# Patient Record
Sex: Female | Born: 1955 | Race: Black or African American | Hispanic: No | Marital: Married
Health system: Southern US, Community
[De-identification: ages and names within clinical notes are randomized; demographics above are authoritative.]

---

## 2014-12-25 ENCOUNTER — Other Ambulatory Visit: Payer: Self-pay | Admitting: Family Medicine

## 2014-12-25 DIAGNOSIS — Z1231 Encounter for screening mammogram for malignant neoplasm of breast: Secondary | ICD-10-CM

## 2014-12-26 ENCOUNTER — Ambulatory Visit
Admission: RE | Admit: 2014-12-26 | Discharge: 2014-12-26 | Disposition: A | Discharge status: Home / Self Care | Payer: 59 | Source: Ambulatory Visit | Attending: Family Medicine | Admitting: Family Medicine

## 2014-12-26 DIAGNOSIS — Z1231 Encounter for screening mammogram for malignant neoplasm of breast: Secondary | ICD-10-CM | POA: Diagnosis not present

## 2017-03-09 ENCOUNTER — Other Ambulatory Visit: Payer: Self-pay | Admitting: Family Medicine

## 2017-03-09 DIAGNOSIS — Z1231 Encounter for screening mammogram for malignant neoplasm of breast: Secondary | ICD-10-CM

## 2019-06-15 ENCOUNTER — Ambulatory Visit: Payer: Medicaid Other | Attending: Internal Medicine

## 2019-06-15 DIAGNOSIS — Z23 Encounter for immunization: Secondary | ICD-10-CM

## 2019-06-15 NOTE — Progress Notes (Signed)
   Covid-19 Vaccination Clinic  Name:  Becky Colan    MRN: [id number] DOB: March 05, 1956  06/15/2019  Ms. Noe was observed post Covid-19 immunization for 15 minutes without incident. She was provided with Vaccine Information Sheet and instruction to access the V-Safe system.   Ms. Ng was instructed to call 911 with any severe reactions post vaccine: Marland Kitchen Difficulty breathing  . Swelling of face and throat  . A fast heartbeat  . A bad rash all over body  . Dizziness and weakness   Immunizations Administered    Name Date Dose VIS Date Route   Pfizer COVID-19 Vaccine 06/15/2019  9:28 AM 0.3 mL 02/23/2019 Intramuscular   Manufacturer: [employer], [employer]   Lot: [id number]   NDC: [address]-6

## 2019-07-06 ENCOUNTER — Ambulatory Visit: Payer: Medicaid Other

## 2019-07-07 ENCOUNTER — Ambulatory Visit: Payer: Medicaid Other | Attending: Internal Medicine

## 2019-07-07 DIAGNOSIS — Z23 Encounter for immunization: Secondary | ICD-10-CM

## 2019-07-07 NOTE — Progress Notes (Signed)
   Covid-19 Vaccination Clinic  Name:  Mallory Alexander    MRN: [id number] DOB: 1955-12-15  07/07/2019  Mallory Alexander was observed post Covid-19 immunization for 15 minutes without incident. She was provided with Vaccine Information Sheet and instruction to access the V-Safe system.   Mallory Alexander was instructed to call 911 with any severe reactions post vaccine: Marland Kitchen Difficulty breathing  . Swelling of face and throat  . A fast heartbeat  . A bad rash all over body  . Dizziness and weakness   Immunizations Administered    Name Date Dose VIS Date Route   Pfizer COVID-19 Vaccine 07/07/2019  9:32 AM 0.3 mL 05/09/2018 Intramuscular   Manufacturer: [employer], [employer]   Lot: [id number]   NDC: [address]-1

## 2020-03-13 ENCOUNTER — Other Ambulatory Visit: Payer: Self-pay | Admitting: Family Medicine

## 2020-03-13 DIAGNOSIS — Z1231 Encounter for screening mammogram for malignant neoplasm of breast: Secondary | ICD-10-CM

## 2020-09-04 ENCOUNTER — Other Ambulatory Visit: Payer: Self-pay | Admitting: Family Medicine

## 2020-09-04 DIAGNOSIS — Z780 Asymptomatic menopausal state: Secondary | ICD-10-CM

## 2020-09-04 DIAGNOSIS — Z1231 Encounter for screening mammogram for malignant neoplasm of breast: Secondary | ICD-10-CM

## 2020-10-09 ENCOUNTER — Other Ambulatory Visit: Payer: Self-pay

## 2020-10-09 ENCOUNTER — Ambulatory Visit
Admission: RE | Admit: 2020-10-09 | Discharge: 2020-10-09 | Disposition: A | Discharge status: Home / Self Care | Payer: Medicare Other | Source: Ambulatory Visit | Attending: Family Medicine | Admitting: Family Medicine

## 2020-10-09 DIAGNOSIS — Z1231 Encounter for screening mammogram for malignant neoplasm of breast: Secondary | ICD-10-CM | POA: Diagnosis present

## 2020-10-10 ENCOUNTER — Inpatient Hospital Stay
Admission: RE | Admit: 2020-10-10 | Discharge: 2020-10-10 | Disposition: A | Discharge status: Home / Self Care | Payer: Self-pay | Source: Ambulatory Visit | Attending: *Deleted | Admitting: *Deleted

## 2020-10-10 ENCOUNTER — Other Ambulatory Visit: Payer: Self-pay | Admitting: *Deleted

## 2020-10-10 DIAGNOSIS — Z1231 Encounter for screening mammogram for malignant neoplasm of breast: Secondary | ICD-10-CM

## 2021-02-11 ENCOUNTER — Other Ambulatory Visit: Payer: Self-pay

## 2021-02-11 ENCOUNTER — Ambulatory Visit
Admission: RE | Admit: 2021-02-11 | Discharge: 2021-02-11 | Disposition: A | Discharge status: Home / Self Care | Payer: Medicare Other | Source: Ambulatory Visit | Attending: Family Medicine | Admitting: Family Medicine

## 2021-02-11 DIAGNOSIS — Z780 Asymptomatic menopausal state: Secondary | ICD-10-CM | POA: Diagnosis not present

## 2021-10-06 ENCOUNTER — Other Ambulatory Visit: Payer: Self-pay | Admitting: Family Medicine

## 2021-10-06 DIAGNOSIS — Z1231 Encounter for screening mammogram for malignant neoplasm of breast: Secondary | ICD-10-CM

## 2021-10-27 ENCOUNTER — Ambulatory Visit: Payer: Medicare Other

## 2021-11-17 ENCOUNTER — Ambulatory Visit
Admission: RE | Admit: 2021-11-17 | Discharge: 2021-11-17 | Disposition: A | Discharge status: Home / Self Care | Payer: Medicare Other | Source: Ambulatory Visit | Attending: Family Medicine | Admitting: Family Medicine

## 2021-11-17 DIAGNOSIS — Z1231 Encounter for screening mammogram for malignant neoplasm of breast: Secondary | ICD-10-CM | POA: Insufficient documentation

## 2022-11-16 ENCOUNTER — Other Ambulatory Visit: Payer: Self-pay | Admitting: Family Medicine

## 2022-11-16 DIAGNOSIS — Z1231 Encounter for screening mammogram for malignant neoplasm of breast: Secondary | ICD-10-CM

## 2022-12-13 ENCOUNTER — Ambulatory Visit
Admission: RE | Admit: 2022-12-13 | Discharge: 2022-12-13 | Disposition: A | Discharge status: Home / Self Care | Payer: Medicare Other | Source: Ambulatory Visit | Attending: Family Medicine | Admitting: Family Medicine

## 2022-12-13 ENCOUNTER — Ambulatory Visit: Payer: Medicare Other

## 2022-12-13 DIAGNOSIS — Z1231 Encounter for screening mammogram for malignant neoplasm of breast: Secondary | ICD-10-CM | POA: Insufficient documentation

## 2023-03-23 IMAGING — MG MM DIGITAL SCREENING BILAT W/ TOMO AND CAD
8 series · 8 of 24 positions shown · non-contrast
Comparison: Previous exam(s).

ACR Breast Density Category a: The breast tissue is almost entirely
fatty.

CLINICAL DATA: Screening.

EXAM:
DIGITAL SCREENING BILATERAL MAMMOGRAM WITH TOMOSYNTHESIS AND CAD
TECHNIQUE: Bilateral screening digital craniocaudal and mediolateral oblique
mammograms were obtained. Bilateral screening digital breast
tomosynthesis was performed. The images were evaluated with
computer-aided detection.

[L MLO synth-2D]
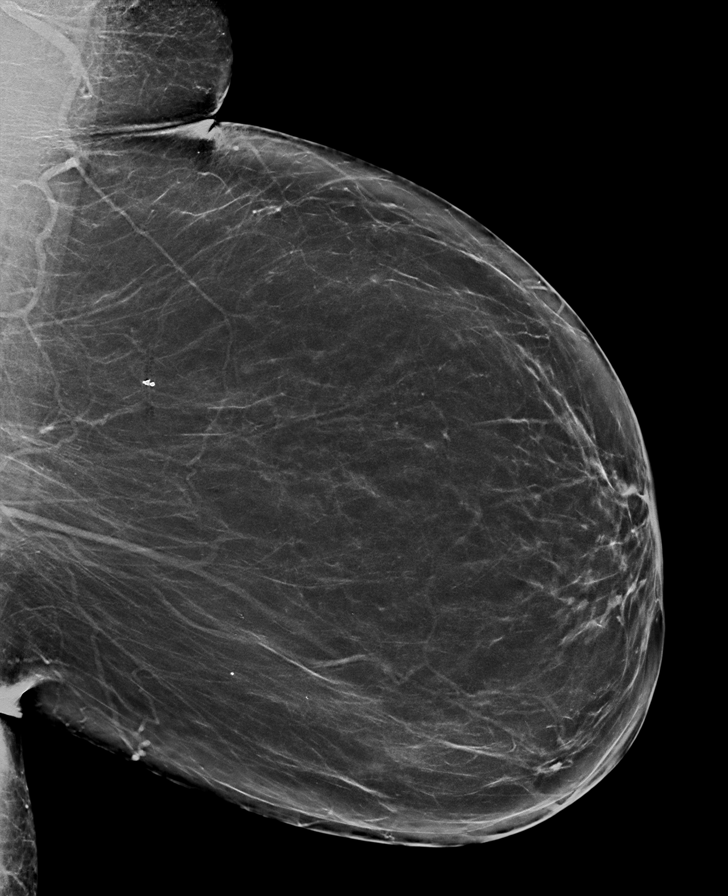

[R CC synth-2D]
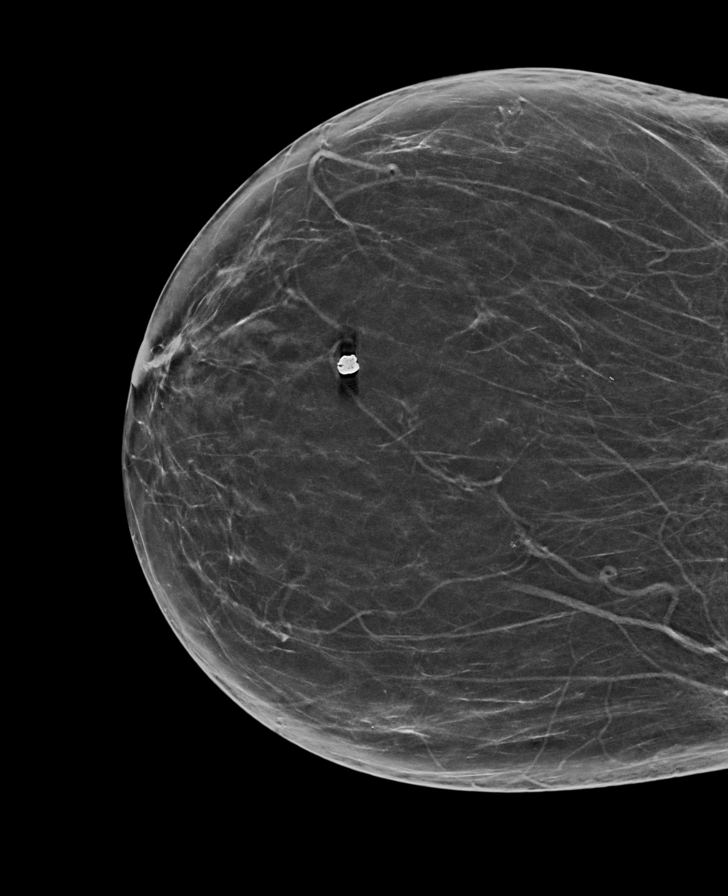

[R MLO synth-2D]
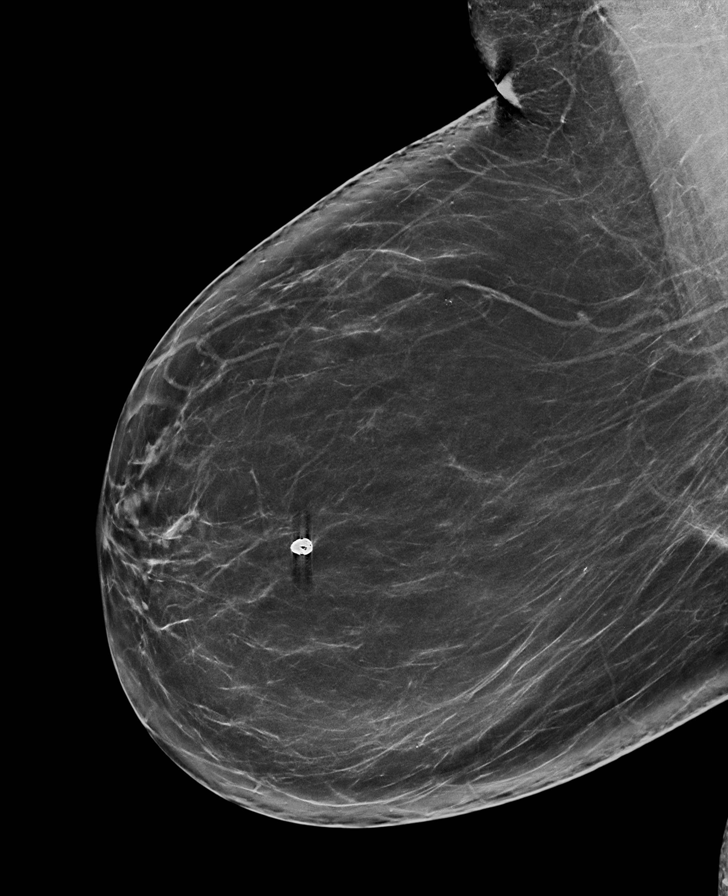

[L CC synth-2D]
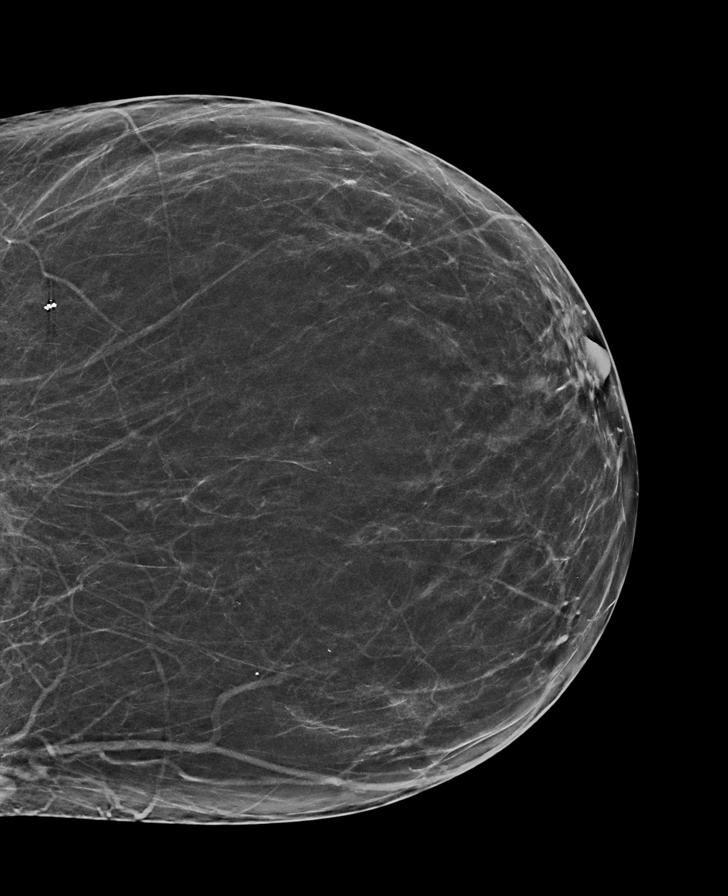

[R CC tomo · tomo slice 35/69.0]
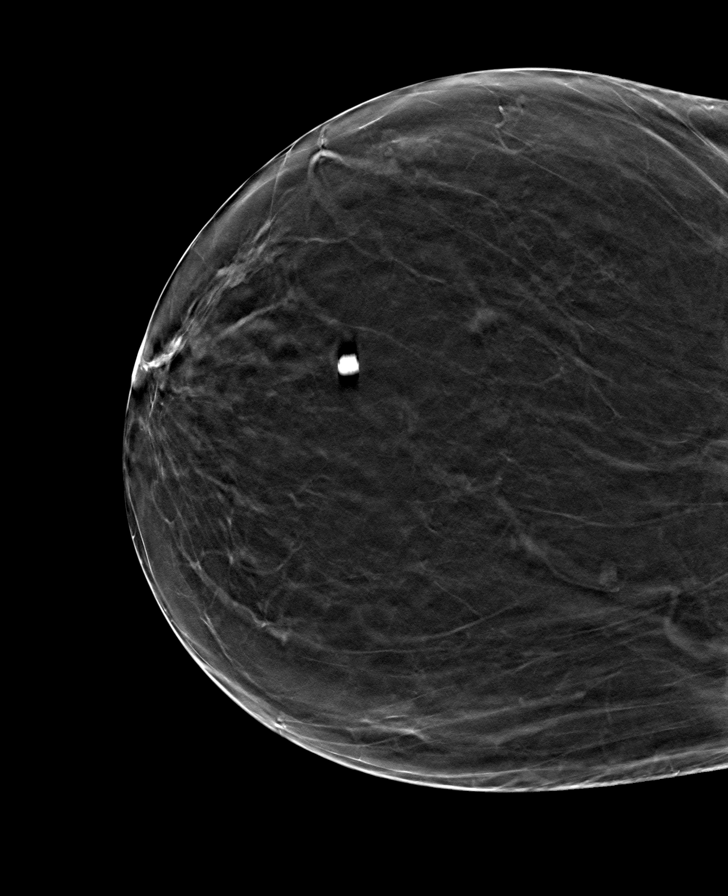

[L MLO tomo · tomo slice 41/80.0]
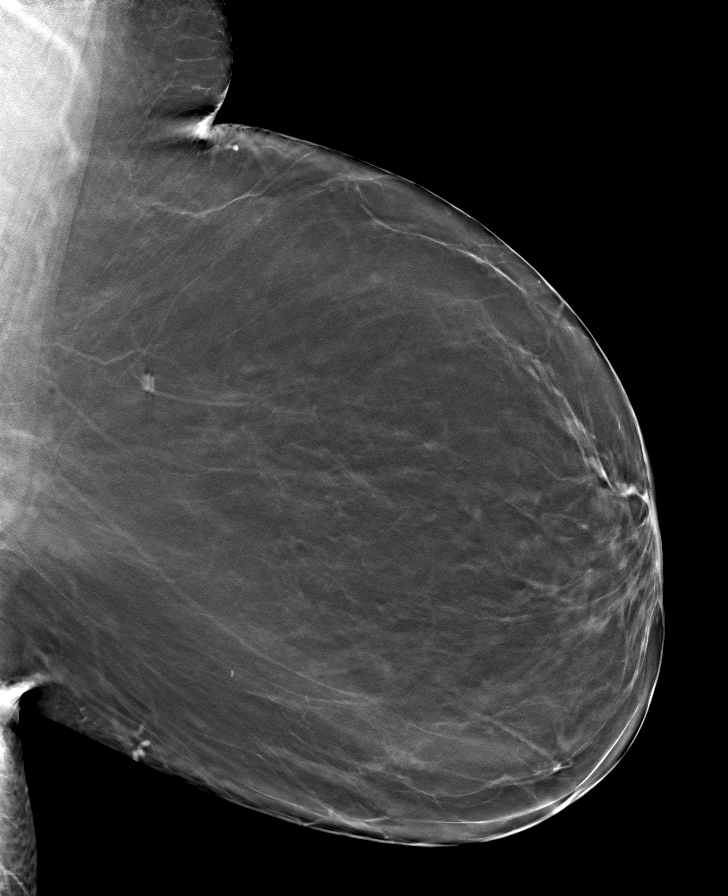

[L CC tomo · tomo slice 35/68.0]
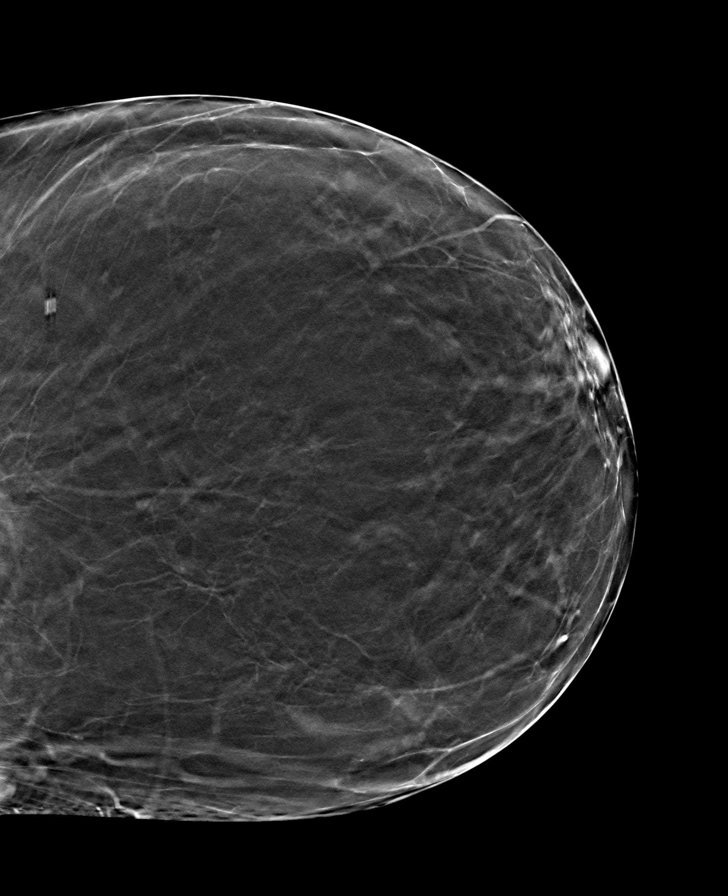

[R MLO tomo · tomo slice 41/80.0]
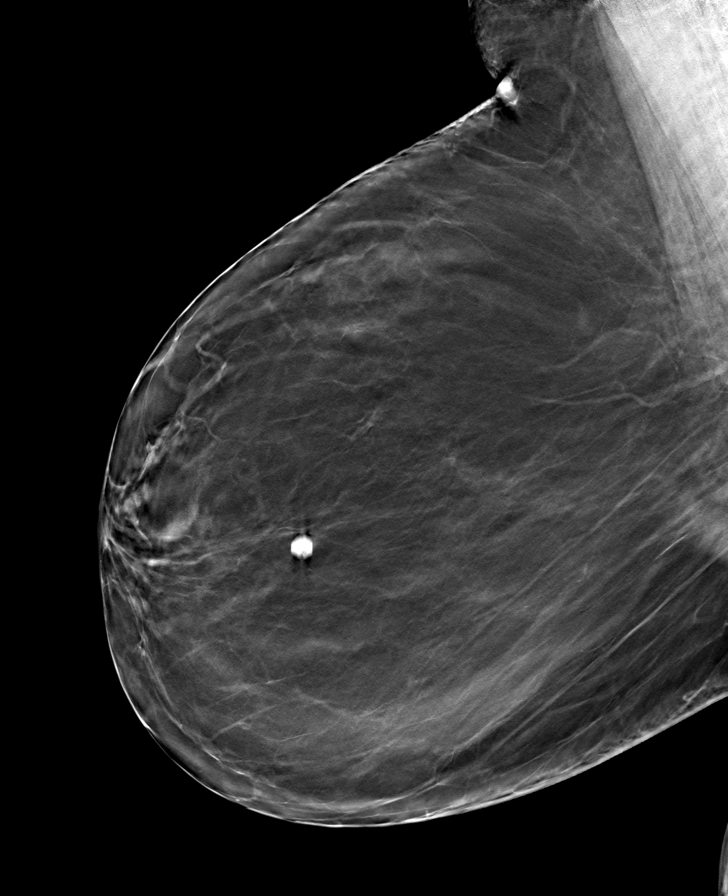

[8 of 24 positions shown; findings below may reference images not displayed]

FINDINGS: There are no findings suspicious for malignancy.
IMPRESSION: No mammographic evidence of malignancy. A result letter of this
screening mammogram will be mailed directly to the patient.

RECOMMENDATION:
Screening mammogram in one year. (Code:[id number]-[id number])

BI-RADS CATEGORY  1: Negative.

## 2023-12-06 ENCOUNTER — Other Ambulatory Visit: Payer: Self-pay | Admitting: Family Medicine

## 2023-12-06 DIAGNOSIS — Z1231 Encounter for screening mammogram for malignant neoplasm of breast: Secondary | ICD-10-CM

## 2023-12-08 ENCOUNTER — Other Ambulatory Visit: Payer: Self-pay | Admitting: Family Medicine

## 2023-12-08 DIAGNOSIS — Z780 Asymptomatic menopausal state: Secondary | ICD-10-CM

## 2023-12-31 LAB — GLUCOSE, POCT (MANUAL RESULT ENTRY): POC Glucose: 75 mg/dL (ref 70–99)

## 2023-12-31 NOTE — Congregational Nurse Program (Signed)
  Dept: [phone]   Congregational Nurse Program Note  Date of Encounter: 12/31/2023  Patient complains of headache 2 out of 10 on pain scale.  Encouraged to drink water and eat less salt to help with elevated BP.   Given handout for hypertension.   Past Medical History: No past medical history on file.  Encounter Details:  Community Questionnaire - 12/31/23 1007       Questionnaire   Ask client: Do you give verbal consent for me to treat you today? Yes    Student Assistance N/A    [facility] S[facility]  [facility]    Encounter Setting CN site    Population Status Unknown    Engineer, building services or [employer]    Insurance/Financial Assistance Referral N/A    Medication N/A    Medical Provider Yes    Screening Referrals Made N/A    Medical Referrals Made N/A    Medical Appointment Completed N/A    CNP Interventions Advocate/Support    Screenings CN Performed Blood Pressure;Blood Glucose    ED Visit Averted N/A    Life-Saving Intervention Made N/A          Today's Vitals   12/31/23 1006  BP: (!) 140/99   There is no height or weight on file to calculate BMI.

## 2024-01-03 ENCOUNTER — Encounter

## 2024-01-14 NOTE — Congregational Nurse Program (Signed)
  Dept: [phone]   Congregational Nurse Program Note  Date of Encounter: 01/14/2024  Past Medical History: No past medical history on file.  Encounter Details:

## 2024-01-28 NOTE — Congregational Nurse Program (Signed)
  Dept: [phone]   Congregational Nurse Program Note  Date of Encounter: 01/28/2024  Past Medical History: No past medical history on file.  Encounter Details:  Community Questionnaire - 01/28/24 1047       Questionnaire   Ask client: Do you give verbal consent for me to treat you today? Yes    Student Assistance N/A    [facility] S[facility]  [facility]    Encounter Setting CN site    Population Status Unknown    Engineer, [employer] or [employer]    Insurance/Financial Assistance Referral N/A    Medication N/A    Medical Provider Yes    Screening Referrals Made N/A    Medical Referrals Made N/A    Medical Appointment Completed N/A    CNP Interventions Advocate/Support    Screenings CN Performed Blood Pressure    ED Visit Averted N/A    Life-Saving Intervention Made N/A

## 2024-01-31 ENCOUNTER — Ambulatory Visit
Admission: RE | Admit: 2024-01-31 | Discharge: 2024-01-31 | Disposition: A | Discharge status: Home / Self Care | Payer: Medicare (Managed Care) | Source: Ambulatory Visit | Attending: Family Medicine | Admitting: Family Medicine

## 2024-01-31 ENCOUNTER — Ambulatory Visit
Admission: RE | Admit: 2024-01-31 | Discharge: 2024-01-31 | Disposition: A | Payer: Medicare (Managed Care) | Source: Ambulatory Visit | Attending: Family Medicine | Admitting: Family Medicine

## 2024-01-31 DIAGNOSIS — Z1231 Encounter for screening mammogram for malignant neoplasm of breast: Secondary | ICD-10-CM | POA: Diagnosis present

## 2024-01-31 DIAGNOSIS — Z780 Asymptomatic menopausal state: Secondary | ICD-10-CM

## 2024-03-31 LAB — GLUCOSE, POCT (MANUAL RESULT ENTRY): POC Glucose: 67 mg/dL — AB (ref 70–99)

## 2024-03-31 NOTE — Congregational Nurse Program (Signed)
" °  Dept: [phone]   Congregational Nurse Program Note  Date of Encounter: 03/31/2024  Patient has high blood pressure today.  Patient interested in getting a blood pressure machine to be able to monitor daily.  I provided handouts for Hypertension and Nutrition. I also gave resource for [facility] heart conference for February 6 in [place].  Also encouraged to find activities that help lower stress.  Past Medical History: No past medical history on file.  Encounter Details:  Community Questionnaire - 03/31/24 1503       Questionnaire   Ask client: Do you give verbal consent for me to treat you today? Yes    Student Assistance N/A    Location [facility] Served  [facility]    Encounter Setting CN site    Population Status Housed    Engineer, [employer] or [employer]    Insurance/Financial Assistance Referral N/A    Medication N/A    Medical Provider Yes    Medical Referrals Made NA    Medical Appointment Completed N/A    Screenings CN Performed (remember to also record results) Blood Pressure;Blood Glucose    CNP Interventions Advocate/Support    ED Visit Averted N/A            "

## 2024-04-20 ENCOUNTER — Encounter: Payer: Self-pay | Admitting: *Deleted

## 2024-04-20 LAB — AMB RESULTS CONSOLE CBG: Glucose: 87

## 2024-04-20 NOTE — Progress Notes (Unsigned)
 This pt attended 04/21/2023 screening event and BP was 137/96, blood glucose was 87. Pt did not indicate any SDOH needs at this time.   At event pt indicated having a PCP and noted Medicare as insurance. Pt also indicated that she is a former smoker.
